# Patient Record
Sex: Male | Born: 2000 | Race: Black or African American | Hispanic: No | Marital: Single | Smoking: Never smoker
Health system: Southern US, Community
[De-identification: ages and names within clinical notes are randomized; demographics above are authoritative.]

## PROBLEM LIST (undated history)

## (undated) DIAGNOSIS — J45909 Unspecified asthma, uncomplicated: Secondary | ICD-10-CM

## (undated) DIAGNOSIS — T7840XA Allergy, unspecified, initial encounter: Secondary | ICD-10-CM

## (undated) HISTORY — DX: Allergy, unspecified, initial encounter: T78.40XA

## (undated) HISTORY — DX: Unspecified asthma, uncomplicated: J45.909

---
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
## 2018-06-11 DIAGNOSIS — Z5181 Encounter for therapeutic drug level monitoring: Secondary | ICD-10-CM | POA: Diagnosis not present

## 2018-06-13 DIAGNOSIS — Z5181 Encounter for therapeutic drug level monitoring: Secondary | ICD-10-CM | POA: Diagnosis not present

## 2018-07-15 ENCOUNTER — Other Ambulatory Visit: Payer: Self-pay

## 2018-07-15 ENCOUNTER — Encounter: Payer: Self-pay | Admitting: *Deleted

## 2018-07-15 ENCOUNTER — Emergency Department
Admission: EM | Admit: 2018-07-15 | Discharge: 2018-07-15 | Disposition: A | Discharge status: Home / Self Care | Payer: Medicaid Other | Attending: Emergency Medicine | Admitting: Emergency Medicine

## 2018-07-15 DIAGNOSIS — R112 Nausea with vomiting, unspecified: Secondary | ICD-10-CM | POA: Diagnosis present

## 2018-07-15 DIAGNOSIS — K529 Noninfective gastroenteritis and colitis, unspecified: Secondary | ICD-10-CM | POA: Insufficient documentation

## 2018-07-15 LAB — COMPREHENSIVE METABOLIC PANEL
ALK PHOS: 70 U/L (ref 38–126)
ALT: 15 U/L (ref 0–44)
AST: 16 U/L (ref 15–41)
Albumin: 4.5 g/dL (ref 3.5–5.0)
Anion gap: 9 (ref 5–15)
BILIRUBIN TOTAL: 1.4 mg/dL — AB (ref 0.3–1.2)
BUN: 16 mg/dL (ref 6–20)
CO2: 26 mmol/L (ref 22–32)
CREATININE: 1.16 mg/dL (ref 0.61–1.24)
Calcium: 9.2 mg/dL (ref 8.9–10.3)
Chloride: 104 mmol/L (ref 98–111)
GFR calc Af Amer: >60 mL/min (ref >60)
GFR calc non Af Amer: >60 mL/min (ref >60)
GLUCOSE: 92 mg/dL (ref 70–99)
POTASSIUM: 3.7 mmol/L (ref 3.5–5.1)
Sodium: 139 mmol/L (ref 135–145)
TOTAL PROTEIN: 7.8 g/dL (ref 6.5–8.1)

## 2018-07-15 LAB — URINALYSIS, COMPLETE (UACMP) WITH MICROSCOPIC
BACTERIA UA: NONE SEEN
Bilirubin Urine: NEGATIVE
Glucose, UA: NEGATIVE mg/dL
HGB URINE DIPSTICK: NEGATIVE
Ketones, ur: 5 mg/dL — AB
Leukocytes, UA: NEGATIVE
Nitrite: NEGATIVE
Protein, ur: NEGATIVE mg/dL
SPECIFIC GRAVITY, URINE: 1.029 (ref 1.005–1.030)
Squamous Epithelial / LPF: NONE SEEN (ref 0–5)
pH: 5 (ref 5.0–8.0)

## 2018-07-15 LAB — CBC
HEMATOCRIT: 46.1 % (ref 40.0–52.0)
Hemoglobin: 15.5 g/dL (ref 13.0–18.0)
MCH: 28.3 pg (ref 26.0–34.0)
MCHC: 33.7 g/dL (ref 32.0–36.0)
MCV: 83.9 fL (ref 80.0–100.0)
Platelets: 185 10*3/uL (ref 150–440)
RBC: 5.5 MIL/uL (ref 4.40–5.90)
RDW: 14.3 % (ref 11.5–14.5)
WBC: 8.5 10*3/uL (ref 3.8–10.6)

## 2018-07-15 LAB — LIPASE, BLOOD: Lipase: 28 U/L (ref 11–51)

## 2018-07-15 MED ORDER — DICYCLOMINE HCL 10 MG PO CAPS
ORAL_CAPSULE | ORAL | Status: AC
  Administered 2018-07-15: 20 mg via ORAL
  Filled 2018-07-15: qty 2

## 2018-07-15 MED ORDER — DICYCLOMINE HCL 10 MG PO CAPS
20.0000 mg | ORAL_CAPSULE | Freq: Once | ORAL | Status: AC
  Administered 2018-07-15: 20 mg via ORAL

## 2018-07-15 MED ORDER — DICYCLOMINE HCL 20 MG PO TABS: 20.0000 mg | ORAL_TABLET | Freq: Three times a day (TID) | ORAL | 0 refills | Status: DC | PRN

## 2018-07-15 NOTE — ED Notes (Signed)
Pt here with family reports abdominal cramping and diarrhea for the past 3 days, no distress noted in room.

## 2018-07-15 NOTE — ED Notes (Signed)
Pt unable to void at this time. 

## 2018-07-15 NOTE — ED Provider Notes (Signed)
[facility] Emergency Department Provider Note  Time seen: 7:09 PM  I have reviewed the triage vital signs and the nursing notes.   HISTORY  Chief Complaint Abdominal Pain and Diarrhea    HPI Donald Levine is a 17 y.o. male with no significant past medical history presents to the emergency department for abdominal cramping and diarrhea.  According to the patient on Friday he developed nausea vomiting abdominal cramping and diarrhea.  He states over the past 2 days and nausea vomiting have stopped but he continues to have episodes of diarrhea and abdominal cramping.  Denies any dysuria or hematuria.  Denies any fever at any point.  Describes her cramping is mild to moderate and intermittent.   No past medical history on file.  There are no active problems to display for this patient.   Prior to Admission medications   Not on File    Allergies  Allergen Reactions  . Azithromycin Rash  . Penicillins Rash    No family history on file.  Social History Social History   Tobacco Use  . Smoking status: Never Smoker  . Smokeless tobacco: Never Used  Substance Use Topics  . Alcohol use: Never    Frequency: Never  . Drug use: Never    Review of Systems Constitutional: Negative for fever. Cardiovascular: Negative for chest pain. Respiratory: Negative for shortness of breath. Gastrointestinal: Mild abdominal cramping.  Negative for vomiting, resolved.  Positive for intermittent diarrhea Genitourinary: Negative for urinary compaints Musculoskeletal: Negative for musculoskeletal complaints Skin: Negative for skin complaints  Neurological: Negative for headache All other ROS negative  ____________________________________________   PHYSICAL EXAM:  VITAL SIGNS: ED Triage Vitals [07/15/18 1800]  Enc Vitals Group     BP 138/70     Pulse Rate 72     Resp 18     Temp 99 F (37.2 C)     Temp Source Oral     SpO2 98 %     Weight 170 lb (77.1 kg)      Height 5\' 5"  (1.651 m)     Head Circumference      Peak Flow      Pain Score 2     Pain Loc      Pain Edu?      Excl. in GC?    Constitutional: Alert and oriented. Well appearing and in no distress. Eyes: Normal exam ENT   Head: Normocephalic and atraumatic.   Mouth/Throat: Mucous membranes are moist. Cardiovascular: Normal rate, regular rhythm.  Respiratory: Normal respiratory effort without tachypnea nor retractions. Breath sounds are clear  Gastrointestinal: Soft and nontender. No distention.  Musculoskeletal: Nontender with normal range of motion in all extremities.   Neurologic:  Normal speech and language. No gross focal neurologic deficits Skin:  Skin is warm, dry and intact.  Psychiatric: Mood and affect are normal. Speech and behavior are normal.   ____________________________________________    INITIAL IMPRESSION / ASSESSMENT AND PLAN / ED COURSE  Pertinent labs & imaging results that were available during my care of the patient were reviewed by me and considered in my medical decision making (see chart for details).  She presents to the emergency department for nausea vomiting diarrhea and abdominal cramping.  Nausea vomiting have resolved but patient continues to have diarrhea and abdominal cramping.  Differential includes gastroenteritis, gastritis, enteritis, viral illness, intra-abdominal pathology such as appendicitis colitis or diverticulitis.  Reassuringly patient has a benign abdominal exam, normal labs including LFTs lipase and white  blood cell count.  Urinalysis pending.  We will treat with Bentyl for abdominal cramping.  Patient agreeable to plan of care.  Urinalysis is normal.  We will discharge patient home with Bentyl and supportive care.  ____________________________________________   FINAL CLINICAL IMPRESSION(S) / ED DIAGNOSES  Gastroenteritis    Minna AntisPaduchowski, Shavon Ashmore, MD 07/15/18 1948

## 2018-07-15 NOTE — ED Triage Notes (Signed)
Pt ambulatory to triage.  Pt has abd pain and diarrhea for 3 days.  No diarrhea or vomiting today.  Pt alert

## 2018-07-17 DIAGNOSIS — K529 Noninfective gastroenteritis and colitis, unspecified: Secondary | ICD-10-CM | POA: Diagnosis not present

## 2018-07-17 DIAGNOSIS — R1013 Epigastric pain: Secondary | ICD-10-CM | POA: Diagnosis not present

## 2018-07-19 DIAGNOSIS — Z00129 Encounter for routine child health examination without abnormal findings: Secondary | ICD-10-CM | POA: Diagnosis not present

## 2018-09-02 DIAGNOSIS — Z5181 Encounter for therapeutic drug level monitoring: Secondary | ICD-10-CM | POA: Diagnosis not present

## 2018-09-23 DIAGNOSIS — J45909 Unspecified asthma, uncomplicated: Secondary | ICD-10-CM | POA: Diagnosis not present

## 2018-09-23 DIAGNOSIS — J309 Allergic rhinitis, unspecified: Secondary | ICD-10-CM | POA: Diagnosis not present

## 2019-02-13 DIAGNOSIS — J4520 Mild intermittent asthma, uncomplicated: Secondary | ICD-10-CM | POA: Diagnosis not present

## 2019-02-13 DIAGNOSIS — J309 Allergic rhinitis, unspecified: Secondary | ICD-10-CM | POA: Diagnosis not present

## 2019-02-13 DIAGNOSIS — R635 Abnormal weight gain: Secondary | ICD-10-CM | POA: Diagnosis not present

## 2019-04-23 DIAGNOSIS — J309 Allergic rhinitis, unspecified: Secondary | ICD-10-CM | POA: Diagnosis not present

## 2019-04-23 DIAGNOSIS — J4520 Mild intermittent asthma, uncomplicated: Secondary | ICD-10-CM | POA: Diagnosis not present

## 2019-04-23 DIAGNOSIS — R635 Abnormal weight gain: Secondary | ICD-10-CM | POA: Diagnosis not present

## 2019-11-12 ENCOUNTER — Ambulatory Visit: Payer: Medicaid Other | Admitting: Family Medicine

## 2019-11-17 ENCOUNTER — Ambulatory Visit: Payer: Medicaid Other | Admitting: Family Medicine

## 2019-12-02 ENCOUNTER — Ambulatory Visit: Payer: Medicaid Other | Admitting: Family Medicine

## 2019-12-12 ENCOUNTER — Ambulatory Visit: Payer: Medicaid Other | Admitting: Family Medicine

## 2019-12-25 ENCOUNTER — Ambulatory Visit: Payer: Medicaid Other | Admitting: Family Medicine

## 2019-12-25 ENCOUNTER — Encounter: Payer: Self-pay | Admitting: Family Medicine

## 2019-12-25 ENCOUNTER — Other Ambulatory Visit: Payer: Self-pay

## 2019-12-25 VITALS — BP 117/53 | HR 64 | Temp 98.6°F | Ht 62.5 in | Wt 185.0 lb

## 2019-12-25 DIAGNOSIS — J4540 Moderate persistent asthma, uncomplicated: Secondary | ICD-10-CM | POA: Diagnosis not present

## 2019-12-25 DIAGNOSIS — Z7689 Persons encountering health services in other specified circumstances: Secondary | ICD-10-CM

## 2019-12-25 DIAGNOSIS — Z833 Family history of diabetes mellitus: Secondary | ICD-10-CM | POA: Diagnosis not present

## 2019-12-25 DIAGNOSIS — J3089 Other allergic rhinitis: Secondary | ICD-10-CM

## 2019-12-25 DIAGNOSIS — R5383 Other fatigue: Secondary | ICD-10-CM | POA: Diagnosis not present

## 2019-12-25 DIAGNOSIS — J45909 Unspecified asthma, uncomplicated: Secondary | ICD-10-CM | POA: Insufficient documentation

## 2019-12-25 MED ORDER — CETIRIZINE HCL 10 MG PO TABS: 10.0000 mg | ORAL_TABLET | Freq: Every day | ORAL | 11 refills | Status: AC

## 2019-12-25 MED ORDER — FLUTICASONE PROPIONATE HFA 44 MCG/ACT IN AERO: 1.0000 | INHALATION_SPRAY | Freq: Two times a day (BID) | RESPIRATORY_TRACT | 11 refills | Status: DC

## 2019-12-25 MED ORDER — ALBUTEROL SULFATE HFA 108 (90 BASE) MCG/ACT IN AERS: INHALATION_SPRAY | RESPIRATORY_TRACT | 5 refills | Status: AC

## 2019-12-25 NOTE — Progress Notes (Signed)
BP (!) 117/53   Pulse 64   Temp 98.6 F (37 C) (Oral)   Ht 5' 2.5" (1.588 m)   Wt 185 lb (83.9 kg)   SpO2 99%   BMI 33.30 kg/m    Subjective:    Patient ID: Donald Levine, male    DOB: 27-Apr-2001, 19 y.o.   MRN: [id number]  HPI: Donald Levine is a 19 y.o. male  Chief Complaint  Patient presents with  . Establish Care   Patient presenting today to establish care. Chronic medical problems include asthma and allergies, currently on flovent, prn albuterol, and zyrtec regimen which seems to keep things under good control.   Mother requesting Vit D levels due to some fatigue, also wanting blood sugar to be checked. Mother has DM, patient unaware of any other fhx of it. Denies hypoglycemia, polyuria, polydipsia, polyphagia. Does not follow a strict diet or exercise.   Relevant past medical, surgical, family and social history reviewed and updated as indicated. Interim medical history since our last visit reviewed. Allergies and medications reviewed and updated.  Review of Systems  Per HPI unless specifically indicated above     Objective:    BP (!) 117/53   Pulse 64   Temp 98.6 F (37 C) (Oral)   Ht 5' 2.5" (1.588 m)   Wt 185 lb (83.9 kg)   SpO2 99%   BMI 33.30 kg/m   Wt Readings from Last 3 Encounters:  12/25/19 185 lb (83.9 kg)  07/15/18 170 lb (77.1 kg) (74 %, Z= 0.65)*   * Growth percentiles are based on CDC (Boys, 2-20 Years) data.    Physical Exam Vitals and nursing note reviewed.  Constitutional:      Appearance: Normal appearance.  HENT:     Head: Atraumatic.  Eyes:     Extraocular Movements: Extraocular movements intact.     Conjunctiva/sclera: Conjunctivae normal.  Cardiovascular:     Rate and Rhythm: Normal rate and regular rhythm.  Pulmonary:     Effort: Pulmonary effort is normal.     Breath sounds: Normal breath sounds.  Abdominal:     General: Bowel sounds are normal.     Palpations: Abdomen is soft.  Musculoskeletal:        General:  Normal range of motion.     Cervical back: Normal range of motion and neck supple.  Skin:    General: Skin is warm and dry.  Neurological:     General: No focal deficit present.     Mental Status: He is oriented to person, place, and time.  Psychiatric:        Mood and Affect: Mood normal.        Thought Content: Thought content normal.        Judgment: Judgment normal.     Results for orders placed or performed in visit on 12/25/19  Comprehensive metabolic panel  Result Value Ref Range   Glucose 82 65 - 99 mg/dL   BUN 8 6 - 20 mg/dL   Creatinine, Ser 9.38 0.76 - 1.27 mg/dL   GFR calc non Af Amer 90 >59 mL/min/1.73   GFR calc Af Amer 104 >59 mL/min/1.73   BUN/Creatinine Ratio 7 (L) 9 - 20   Sodium 141 134 - 144 mmol/L   Potassium 4.5 3.5 - 5.2 mmol/L   Chloride 101 96 - 106 mmol/L   CO2 25 20 - 29 mmol/L   Calcium 9.9 8.7 - 10.2 mg/dL   Total Protein 7.2 6.0 -  8.5 g/dL   Albumin 4.6 4.1 - 5.2 g/dL   Globulin, Total 2.6 1.5 - 4.5 g/dL   Albumin/Globulin Ratio 1.8 1.2 - 2.2   Bilirubin Total 1.1 0.0 - 1.2 mg/dL   Alkaline Phosphatase 76 39 - 117 IU/L   AST 17 0 - 40 IU/L   ALT 19 0 - 44 IU/L  HgB A1c  Result Value Ref Range   Hgb A1c MFr Bld 5.5 4.8 - 5.6 %   Est. average glucose Bld gHb Est-mCnc 111 mg/dL  Vitamin D (25 hydroxy)  Result Value Ref Range   Vit D, 25-Hydroxy 11.0 (L) 30.0 - 100.0 ng/mL      Assessment & Plan:   Problem List Items Addressed This Visit      Respiratory   Asthma - Primary    Stable and well controlled, continue current regimen      Relevant Medications   fluticasone (FLOVENT HFA) 44 MCG/ACT inhaler   albuterol (PROAIR HFA) 108 (90 Base) MCG/ACT inhaler   Allergic rhinitis    Stable and under good control, continue current regimen       Other Visit Diagnoses    Encounter to establish care       Fatigue, unspecified type       Will check labs as requested, CPE in near future   Relevant Orders   Vitamin D (25 hydroxy)  (Completed)   Family history of diabetes mellitus       Will check A1C per mother's request given fhx, obesity   Relevant Orders   Comprehensive metabolic panel (Completed)   HgB A1c (Completed)       Follow up plan: Return in about 1 year (around 12/24/2020) for CPE.

## 2019-12-26 LAB — COMPREHENSIVE METABOLIC PANEL
ALT: 19 IU/L (ref 0–44)
AST: 17 IU/L (ref 0–40)
Albumin/Globulin Ratio: 1.8 (ref 1.2–2.2)
Albumin: 4.6 g/dL (ref 4.1–5.2)
Alkaline Phosphatase: 76 IU/L (ref 39–117)
BUN/Creatinine Ratio: 7 — ABNORMAL LOW (ref 9–20)
BUN: 8 mg/dL (ref 6–20)
Bilirubin Total: 1.1 mg/dL (ref 0.0–1.2)
CO2: 25 mmol/L (ref 20–29)
Calcium: 9.9 mg/dL (ref 8.7–10.2)
Chloride: 101 mmol/L (ref 96–106)
Creatinine, Ser: 1.16 mg/dL (ref 0.76–1.27)
GFR calc Af Amer: 104 mL/min/{1.73_m2} (ref >59)
GFR calc non Af Amer: 90 mL/min/{1.73_m2} (ref >59)
Globulin, Total: 2.6 g/dL (ref 1.5–4.5)
Glucose: 82 mg/dL (ref 65–99)
Potassium: 4.5 mmol/L (ref 3.5–5.2)
Sodium: 141 mmol/L (ref 134–144)
Total Protein: 7.2 g/dL (ref 6.0–8.5)

## 2019-12-26 LAB — HEMOGLOBIN A1C
Est. average glucose Bld gHb Est-mCnc: 111 mg/dL
Hgb A1c MFr Bld: 5.5 % (ref 4.8–5.6)

## 2019-12-26 LAB — VITAMIN D 25 HYDROXY (VIT D DEFICIENCY, FRACTURES): Vit D, 25-Hydroxy: 11 ng/mL — ABNORMAL LOW (ref 30.0–100.0)

## 2019-12-29 ENCOUNTER — Other Ambulatory Visit: Payer: Self-pay | Admitting: Family Medicine

## 2019-12-29 MED ORDER — VITAMIN D (ERGOCALCIFEROL) 1.25 MG (50000 UNIT) PO CAPS: 50000.0000 [IU] | ORAL_CAPSULE | ORAL | 3 refills | Status: AC

## 2019-12-30 DIAGNOSIS — E559 Vitamin D deficiency, unspecified: Secondary | ICD-10-CM | POA: Insufficient documentation

## 2019-12-30 DIAGNOSIS — J309 Allergic rhinitis, unspecified: Secondary | ICD-10-CM | POA: Insufficient documentation

## 2019-12-30 NOTE — Assessment & Plan Note (Signed)
Stable and under good control, continue current regimen 

## 2019-12-30 NOTE — Assessment & Plan Note (Signed)
Stable and well controlled, continue current regimen 

## 2020-04-27 DIAGNOSIS — Z79899 Other long term (current) drug therapy: Secondary | ICD-10-CM | POA: Diagnosis not present

## 2020-04-29 DIAGNOSIS — Z79899 Other long term (current) drug therapy: Secondary | ICD-10-CM | POA: Diagnosis not present

## 2020-05-31 DIAGNOSIS — Z79899 Other long term (current) drug therapy: Secondary | ICD-10-CM | POA: Diagnosis not present

## 2020-06-02 DIAGNOSIS — Z79899 Other long term (current) drug therapy: Secondary | ICD-10-CM | POA: Diagnosis not present

## 2020-11-18 DIAGNOSIS — Z20822 Contact with and (suspected) exposure to covid-19: Secondary | ICD-10-CM | POA: Diagnosis not present

## 2021-06-02 ENCOUNTER — Other Ambulatory Visit: Payer: Self-pay | Admitting: Family Medicine

## 2021-06-02 NOTE — Telephone Encounter (Signed)
  Notes to clinic:  script requested has expired  Review for continued use and refill    Requested Prescriptions  Pending Prescriptions Disp Refills   albuterol (VENTOLIN HFA) 108 (90 Base) MCG/ACT inhaler [Pharmacy Med Name: ALBUTEROL HFA (VENTOLIN) INH] 18 each 5    Sig: TAKE 2 PUFFS BY MOUTH EVERY 4 TO 6 HOURS AS NEEDED      Pulmonology:  Beta Agonists Failed - 06/02/2021  9:04 AM      Failed - One inhaler should last at least one month. If the patient is requesting refills earlier, contact the patient to check for uncontrolled symptoms.      Failed - Valid encounter within last 12 months    Recent Outpatient Visits           1 year ago Moderate persistent asthma without complication   [facility] Particia Nearing, [place]

## 2021-06-03 ENCOUNTER — Other Ambulatory Visit: Payer: Self-pay | Admitting: Family Medicine

## 2021-06-03 NOTE — Telephone Encounter (Signed)
Lvm to make this apt. 

## 2021-06-03 NOTE — Telephone Encounter (Signed)
Lvm to make this f.u/ med refill

## 2021-06-03 NOTE — Telephone Encounter (Signed)
  Notes to clinic:  script requested has expired  Review for continued use and refill    Requested Prescriptions  Pending Prescriptions Disp Refills   FLOVENT HFA 44 MCG/ACT inhaler [Pharmacy Med Name: FLOVENT HFA 44 MCG INHALER] 10.6 each 11    Sig: TAKE 1 PUFF BY MOUTH TWICE A DAY      Pulmonology:  Corticosteroids Failed - 06/03/2021  9:10 AM      Failed - Valid encounter within last 12 months    Recent Outpatient Visits           1 year ago Moderate persistent asthma without complication   [facility] Particia Nearing, [place]

## 2021-06-14 NOTE — Telephone Encounter (Signed)
Lvm to make this apt. 

## 2021-06-15 NOTE — Telephone Encounter (Signed)
Patient will need an appointment.  

## 2021-06-15 NOTE — Telephone Encounter (Signed)
PT HAS APT ON 06/23/2021

## 2021-06-15 NOTE — Telephone Encounter (Signed)
FYI pt has apt on 06/23/2021

## 2021-06-23 ENCOUNTER — Ambulatory Visit: Payer: Medicaid Other | Admitting: Nurse Practitioner

## 2021-06-23 NOTE — Progress Notes (Deleted)
   There were no vitals taken for this visit.   Subjective:    Patient ID: Donald Levine, male    DOB: Jul 26, 2001, 20 y.o.   MRN: [id number]  HPI: Donald Levine is a 20 y.o. male  No chief complaint on file.  ASTHMA Asthma status: {Blank single:19197::"controlled","uncontrolled","better","worse","exacerbated","stable"} Satisfied with current treatment?: {Blank single:19197::"yes","no"} Albuterol/rescue inhaler frequency:  Dyspnea frequency:  Wheezing frequency: Cough frequency:  Nocturnal symptom frequency:  Limitation of activity: {Blank single:19197::"yes","no"} Current upper respiratory symptoms: {Blank single:19197::"yes","no"} Triggers:  Home peak flows: Last Spirometry:  Failed/intolerant to following asthma meds:  Asthma meds in past:  Aerochamber/spacer use: {Blank single:19197::"yes","no"} Visits to ER or Urgent Care in past year: {Blank single:19197::"yes","no"} Pneumovax: {Blank single:19197::"Up to Date","Not up to Date","unknown"} Influenza: {Blank single:19197::"Up to Date","Not up to Date","unknown"}  Relevant past medical, surgical, family and social history reviewed and updated as indicated. Interim medical history since our last visit reviewed. Allergies and medications reviewed and updated.  Review of Systems  Per HPI unless specifically indicated above     Objective:    There were no vitals taken for this visit.  Wt Readings from Last 3 Encounters:  12/25/19 185 lb (83.9 kg)  07/15/18 170 lb (77.1 kg) (74 %, Z= 0.65)*   * Growth percentiles are based on CDC (Boys, 2-20 Years) data.    Physical Exam  Results for orders placed or performed in visit on 12/25/19  Comprehensive metabolic panel  Result Value Ref Range   Glucose 82 65 - 99 mg/dL   BUN 8 6 - 20 mg/dL   Creatinine, Ser 7.40 0.76 - 1.27 mg/dL   GFR calc non Af Amer 90 >59 mL/min/1.73   GFR calc Af Amer 104 >59 mL/min/1.73   BUN/Creatinine Ratio 7 (L) 9 - 20   Sodium 141 134 - 144  mmol/L   Potassium 4.5 3.5 - 5.2 mmol/L   Chloride 101 96 - 106 mmol/L   CO2 25 20 - 29 mmol/L   Calcium 9.9 8.7 - 10.2 mg/dL   Total Protein 7.2 6.0 - 8.5 g/dL   Albumin 4.6 4.1 - 5.2 g/dL   Globulin, Total 2.6 1.5 - 4.5 g/dL   Albumin/Globulin Ratio 1.8 1.2 - 2.2   Bilirubin Total 1.1 0.0 - 1.2 mg/dL   Alkaline Phosphatase 76 39 - 117 IU/L   AST 17 0 - 40 IU/L   ALT 19 0 - 44 IU/L  HgB A1c  Result Value Ref Range   Hgb A1c MFr Bld 5.5 4.8 - 5.6 %   Est. average glucose Bld gHb Est-mCnc 111 mg/dL  Vitamin D (25 hydroxy)  Result Value Ref Range   Vit D, 25-Hydroxy 11.0 (L) 30.0 - 100.0 ng/mL      Assessment & Plan:   Problem List Items Addressed This Visit       Respiratory   Asthma     Other   Vitamin D deficiency - Primary     Follow up plan: No follow-ups on file.

## 2023-03-22 ENCOUNTER — Emergency Department: Payer: No Typology Code available for payment source

## 2023-03-22 ENCOUNTER — Emergency Department
Admission: EM | Admit: 2023-03-22 | Discharge: 2023-03-22 | Disposition: A | Discharge status: Home / Self Care | Payer: No Typology Code available for payment source | Attending: Emergency Medicine | Admitting: Emergency Medicine

## 2023-03-22 DIAGNOSIS — M542 Cervicalgia: Secondary | ICD-10-CM | POA: Diagnosis present

## 2023-03-22 DIAGNOSIS — Y92410 Unspecified street and highway as the place of occurrence of the external cause: Secondary | ICD-10-CM | POA: Diagnosis not present

## 2023-03-22 DIAGNOSIS — H9201 Otalgia, right ear: Secondary | ICD-10-CM | POA: Insufficient documentation

## 2023-03-22 DIAGNOSIS — M25532 Pain in left wrist: Secondary | ICD-10-CM | POA: Diagnosis not present

## 2023-03-22 MED ORDER — MELOXICAM 15 MG PO TABS: 15.0000 mg | ORAL_TABLET | Freq: Every day | ORAL | 1 refills | Status: AC
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
## 2023-03-22 NOTE — Discharge Instructions (Signed)
Take Meloxicam once daily for pain and inflammation.
